# Patient Record
Sex: Female | Born: 1995
Health system: Southern US, Community
[De-identification: ages and names within clinical notes are randomized; demographics above are authoritative.]

---
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
## 2015-08-09 DIAGNOSIS — Z6820 Body mass index (BMI) 20.0-20.9, adult: Secondary | ICD-10-CM | POA: Diagnosis not present

## 2015-08-09 DIAGNOSIS — Z01419 Encounter for gynecological examination (general) (routine) without abnormal findings: Secondary | ICD-10-CM | POA: Diagnosis not present

## 2015-11-14 ENCOUNTER — Ambulatory Visit: Payer: Self-pay

## 2015-11-14 ENCOUNTER — Encounter: Payer: 59 | Admitting: Podiatry

## 2015-11-28 ENCOUNTER — Ambulatory Visit (INDEPENDENT_AMBULATORY_CARE_PROVIDER_SITE_OTHER): Payer: 59 | Admitting: Podiatry

## 2015-11-28 VITALS — BP 101/70 | HR 78 | Resp 16

## 2015-11-28 DIAGNOSIS — B078 Other viral warts: Secondary | ICD-10-CM

## 2015-11-28 DIAGNOSIS — B079 Viral wart, unspecified: Secondary | ICD-10-CM

## 2015-11-28 MED ORDER — FLUOROURACIL 5 % EX CREA: TOPICAL_CREAM | Freq: Two times a day (BID) | CUTANEOUS | Status: AC

## 2015-11-28 NOTE — Progress Notes (Signed)
   Subjective:    Patient ID: Ashley Acosta, female    DOB: 06/22/1996, 20 y.o.   MRN: [id number]  HPI    Review of Systems  All other systems reviewed and are negative.      Objective:   Physical Exam        Assessment & Plan:

## 2015-11-28 NOTE — Progress Notes (Signed)
Subjective:     Patient ID: Ashley Acosta, female   DOB: 08/28/1995, 20 y.o.   MRN: [id number]  HPI patient states I have lesions on the plantar aspect of my big toe right over left that has been going on now for approximately one year. I do not remember any kind of injury   Review of Systems  All other systems reviewed and are negative.      Objective:   Physical Exam  Constitutional: She is oriented to person, place, and time.  Cardiovascular: Intact distal pulses.   Musculoskeletal: Normal range of motion.  Neurological: She is oriented to person, place, and time.  Skin: Skin is warm.  Nursing note and vitals reviewed.  neurovascular status intact muscle strength adequate range of motion within normal limits with patient found to have numerous lesions plantar aspect right hallux and one on the plantar aspect of the left first metatarsal that upon debridement show pinpoint bleeding and are painful to lateral pressure. Patient has had a history of warts when she was a child     Assessment:     Verruca plantaris plantar aspect right hallux left first metatarsal    Plan:     H&P condition reviewed and today deep debridement accomplished with sterile instrumentation and apply chemical to create an immune reaction to both right hallux left first metatarsal and sterile dressings. I then prescribed Efudex to use at home 2 times a day and reappoint one month or earlier if needed and explained what to do if any blistering were to occur

## 2015-11-29 MED FILL — FLUOROURACIL 5% CREAM: 5 | 20 days supply | Qty: 40 | Fill #0

## 2015-11-29 NOTE — Progress Notes (Signed)
This encounter was created in error - please disregard.

## 2015-12-26 ENCOUNTER — Ambulatory Visit (INDEPENDENT_AMBULATORY_CARE_PROVIDER_SITE_OTHER): Payer: 59 | Admitting: Podiatry

## 2015-12-26 DIAGNOSIS — B079 Viral wart, unspecified: Secondary | ICD-10-CM | POA: Diagnosis not present

## 2015-12-26 DIAGNOSIS — B078 Other viral warts: Secondary | ICD-10-CM

## 2015-12-26 NOTE — Progress Notes (Signed)
Subjective:     Patient ID: Ashley Acosta, female   DOB: 09/08/1995, 20 y.o.   MRN: [id number]  HPI patient presents stating that the lesions are still present with moderate improvement and is using the topical medicine   Review of Systems     Objective:   Physical Exam Neurovascular status intact with continued discomfort and keratotic tissue right hallux and left first MPJ with upon debridement pinpoint bleeding and pain to lateral pressure    Assessment:     Verruca plantaris plantar aspect bilateral    Plan:     Reviewed condition and recommended continued conservative care. I went ahead today debrided tissue fully and applied chemical agent to create an immune response and applied sterile dressings. Patient was explain what to do if any blistering should occur and will be seen back to recheck in 4 weeks

## 2015-12-31 DIAGNOSIS — F331 Major depressive disorder, recurrent, moderate: Secondary | ICD-10-CM | POA: Diagnosis not present

## 2015-12-31 DIAGNOSIS — F411 Generalized anxiety disorder: Secondary | ICD-10-CM | POA: Diagnosis not present

## 2016-01-15 DIAGNOSIS — F411 Generalized anxiety disorder: Secondary | ICD-10-CM | POA: Diagnosis not present

## 2016-01-15 DIAGNOSIS — F331 Major depressive disorder, recurrent, moderate: Secondary | ICD-10-CM | POA: Diagnosis not present

## 2016-01-18 DIAGNOSIS — Z114 Encounter for screening for human immunodeficiency virus [HIV]: Secondary | ICD-10-CM | POA: Diagnosis not present

## 2016-01-18 DIAGNOSIS — Z113 Encounter for screening for infections with a predominantly sexual mode of transmission: Secondary | ICD-10-CM | POA: Diagnosis not present

## 2016-01-23 ENCOUNTER — Ambulatory Visit (INDEPENDENT_AMBULATORY_CARE_PROVIDER_SITE_OTHER): Payer: 59 | Admitting: Podiatry

## 2016-01-23 DIAGNOSIS — B070 Plantar wart: Secondary | ICD-10-CM | POA: Diagnosis not present

## 2016-01-23 DIAGNOSIS — B079 Viral wart, unspecified: Secondary | ICD-10-CM

## 2016-01-23 DIAGNOSIS — B078 Other viral warts: Secondary | ICD-10-CM

## 2016-01-23 NOTE — Progress Notes (Signed)
Subjective:     Patient ID: Ashley Acosta, female   DOB: October 20, 1995, 20 y.o.   MRN: [id number]  HPI patient states I seem to be improving on my left foot but I still have lesions   Review of Systems     Objective:   Physical Exam Neurovascular status intact muscle strength adequate with patient still noted to have keratotic lesions plantar aspect left that upon debridement show pinpoint bleeding and plain the lateral pressure    Assessment:     Verruca plantaris plantar left    Plan:     Sterile debridement of all lesions accomplished and applied chemical agent to create an immune response was sterile dressing. Continue home FU DEXA treatment

## 2016-01-28 DIAGNOSIS — F331 Major depressive disorder, recurrent, moderate: Secondary | ICD-10-CM | POA: Diagnosis not present

## 2016-01-28 DIAGNOSIS — F411 Generalized anxiety disorder: Secondary | ICD-10-CM | POA: Diagnosis not present

## 2016-02-20 ENCOUNTER — Ambulatory Visit: Payer: 59 | Admitting: Podiatry

## 2016-12-17 DIAGNOSIS — Z3200 Encounter for pregnancy test, result unknown: Secondary | ICD-10-CM | POA: Diagnosis not present

## 2016-12-17 DIAGNOSIS — Z113 Encounter for screening for infections with a predominantly sexual mode of transmission: Secondary | ICD-10-CM | POA: Diagnosis not present

## 2016-12-17 DIAGNOSIS — N760 Acute vaginitis: Secondary | ICD-10-CM | POA: Diagnosis not present

## 2016-12-17 DIAGNOSIS — Z01419 Encounter for gynecological examination (general) (routine) without abnormal findings: Secondary | ICD-10-CM | POA: Diagnosis not present

## 2016-12-17 DIAGNOSIS — Z681 Body mass index (BMI) 19 or less, adult: Secondary | ICD-10-CM | POA: Diagnosis not present

## 2017-06-03 DIAGNOSIS — B373 Candidiasis of vulva and vagina: Secondary | ICD-10-CM | POA: Diagnosis not present

## 2019-01-21 ENCOUNTER — Institutional Professional Consult (permissible substitution): Payer: Self-pay | Admitting: Plastic Surgery

## 2021-07-31 DIAGNOSIS — F331 Major depressive disorder, recurrent, moderate: Secondary | ICD-10-CM | POA: Diagnosis not present

## 2021-08-28 DIAGNOSIS — F331 Major depressive disorder, recurrent, moderate: Secondary | ICD-10-CM | POA: Diagnosis not present

## 2021-09-17 DIAGNOSIS — F331 Major depressive disorder, recurrent, moderate: Secondary | ICD-10-CM | POA: Diagnosis not present

## 2021-10-09 DIAGNOSIS — F331 Major depressive disorder, recurrent, moderate: Secondary | ICD-10-CM | POA: Diagnosis not present

## 2021-11-13 DIAGNOSIS — F331 Major depressive disorder, recurrent, moderate: Secondary | ICD-10-CM | POA: Diagnosis not present

## 2021-12-06 DIAGNOSIS — Z118 Encounter for screening for other infectious and parasitic diseases: Secondary | ICD-10-CM | POA: Diagnosis not present

## 2021-12-06 DIAGNOSIS — Z1159 Encounter for screening for other viral diseases: Secondary | ICD-10-CM | POA: Diagnosis not present

## 2021-12-06 DIAGNOSIS — Z6820 Body mass index (BMI) 20.0-20.9, adult: Secondary | ICD-10-CM | POA: Diagnosis not present

## 2021-12-06 DIAGNOSIS — Z124 Encounter for screening for malignant neoplasm of cervix: Secondary | ICD-10-CM | POA: Diagnosis not present

## 2021-12-06 DIAGNOSIS — Z113 Encounter for screening for infections with a predominantly sexual mode of transmission: Secondary | ICD-10-CM | POA: Diagnosis not present

## 2021-12-06 DIAGNOSIS — Z01419 Encounter for gynecological examination (general) (routine) without abnormal findings: Secondary | ICD-10-CM | POA: Diagnosis not present

## 2021-12-06 DIAGNOSIS — Z114 Encounter for screening for human immunodeficiency virus [HIV]: Secondary | ICD-10-CM | POA: Diagnosis not present

## 2021-12-11 DIAGNOSIS — F331 Major depressive disorder, recurrent, moderate: Secondary | ICD-10-CM | POA: Diagnosis not present

## 2021-12-12 ENCOUNTER — Ambulatory Visit: Payer: BC Managed Care – PPO | Admitting: Family Medicine

## 2021-12-12 ENCOUNTER — Encounter: Payer: Self-pay | Admitting: Family Medicine

## 2021-12-12 VITALS — BP 102/68 | HR 90 | Temp 98.1°F | Resp 16 | Ht 65.0 in | Wt 123.0 lb

## 2021-12-12 DIAGNOSIS — Z7689 Persons encountering health services in other specified circumstances: Secondary | ICD-10-CM

## 2021-12-12 DIAGNOSIS — R41840 Attention and concentration deficit: Secondary | ICD-10-CM | POA: Diagnosis not present

## 2021-12-12 DIAGNOSIS — F32A Depression, unspecified: Secondary | ICD-10-CM

## 2021-12-12 DIAGNOSIS — F419 Anxiety disorder, unspecified: Secondary | ICD-10-CM | POA: Diagnosis not present

## 2021-12-12 NOTE — Progress Notes (Signed)
Patient is her to established care. Patient is concern if she has ADHD. Patient said she has been researching and my have it

## 2021-12-12 NOTE — Progress Notes (Signed)
   New Patient Office Visit  Subjective    Patient ID: Ashley Acosta, female    DOB: [birth year]  Age: 26 y.o. MRN: [id number]  CC:  Chief Complaint  Patient presents with   Establish Care    HPI Ashley Acosta presents to establish care and for concern of attention deficit disorder. Patient reports that she is concerned that she may have ADHD. She has had meds for depression/anxiety in the past and had thought that her sx my be related.    No outpatient encounter medications on file as of 12/12/2021.   No facility-administered encounter medications on file as of 12/12/2021.    History reviewed. No pertinent past medical history.  History reviewed. No pertinent surgical history.  Family History  Problem Relation Age of Onset   Drug abuse Sister     Social History   Socioeconomic History   Marital status: Single    Spouse name: Not on file   Number of children: Not on file   Years of education: Not on file   Highest education level: Not on file  Occupational History   Not on file  Tobacco Use   Smoking status: Never   Smokeless tobacco: Never  Substance and Sexual Activity   Alcohol use: Yes   Drug use: Not Currently   Sexual activity: Not Currently  Other Topics Concern   Not on file  Social History Narrative   Not on file   Social Determinants of Health   Financial Resource Strain: Not on file  Food Insecurity: Not on file  Transportation Needs: Not on file  Physical Activity: Not on file  Stress: Not on file  Social Connections: Not on file  Intimate Partner Violence: Not on file    Review of Systems  Psychiatric/Behavioral:  Positive for depression. Negative for suicidal ideas. The patient is nervous/anxious.   All other systems reviewed and are negative.       Objective    BP 102/68   Pulse 90   Temp 98.1 F (36.7 C) (Oral)   Resp 16   Ht 5\' 5"  (1.651 m)   Wt 123 lb (55.8 kg)   SpO2 97%   BMI 20.47 kg/m   Physical  Exam Vitals and nursing note reviewed.  Constitutional:      General: She is not in acute distress. Cardiovascular:     Rate and Rhythm: Normal rate and regular rhythm.  Pulmonary:     Effort: Pulmonary effort is normal.     Breath sounds: Normal breath sounds.  Neurological:     General: No focal deficit present.     Mental Status: She is alert and oriented to person, place, and time.  Psychiatric:        Mood and Affect: Affect normal. Mood is anxious.        Speech: Speech is rapid and pressured.        Behavior: Behavior normal. Behavior is cooperative.         Assessment & Plan:   1. Attention deficit Patient to be evaluated by her counselor regarding sx  2. Anxiety and depression Continue present management (counselor)  3. Encounter to establish care     No follow-ups on file.   , MD

## 2022-01-08 DIAGNOSIS — F331 Major depressive disorder, recurrent, moderate: Secondary | ICD-10-CM | POA: Diagnosis not present

## 2022-01-22 DIAGNOSIS — F331 Major depressive disorder, recurrent, moderate: Secondary | ICD-10-CM | POA: Diagnosis not present

## 2022-02-04 DIAGNOSIS — F331 Major depressive disorder, recurrent, moderate: Secondary | ICD-10-CM | POA: Diagnosis not present

## 2022-02-04 DIAGNOSIS — F332 Major depressive disorder, recurrent severe without psychotic features: Secondary | ICD-10-CM | POA: Diagnosis not present

## 2022-02-18 DIAGNOSIS — F331 Major depressive disorder, recurrent, moderate: Secondary | ICD-10-CM | POA: Diagnosis not present

## 2022-02-18 DIAGNOSIS — F332 Major depressive disorder, recurrent severe without psychotic features: Secondary | ICD-10-CM | POA: Diagnosis not present

## 2022-02-19 DIAGNOSIS — F331 Major depressive disorder, recurrent, moderate: Secondary | ICD-10-CM | POA: Diagnosis not present

## 2022-04-01 DIAGNOSIS — F332 Major depressive disorder, recurrent severe without psychotic features: Secondary | ICD-10-CM | POA: Diagnosis not present

## 2022-04-01 DIAGNOSIS — F331 Major depressive disorder, recurrent, moderate: Secondary | ICD-10-CM | POA: Diagnosis not present

## 2022-06-04 DIAGNOSIS — F331 Major depressive disorder, recurrent, moderate: Secondary | ICD-10-CM | POA: Diagnosis not present

## 2022-06-04 DIAGNOSIS — Z79899 Other long term (current) drug therapy: Secondary | ICD-10-CM | POA: Diagnosis not present

## 2022-09-03 DIAGNOSIS — F331 Major depressive disorder, recurrent, moderate: Secondary | ICD-10-CM | POA: Diagnosis not present

## 2022-09-04 DIAGNOSIS — B354 Tinea corporis: Secondary | ICD-10-CM | POA: Diagnosis not present

## 2022-12-03 DIAGNOSIS — F331 Major depressive disorder, recurrent, moderate: Secondary | ICD-10-CM | POA: Diagnosis not present

## 2022-12-24 DIAGNOSIS — F331 Major depressive disorder, recurrent, moderate: Secondary | ICD-10-CM | POA: Diagnosis not present

## 2022-12-26 DIAGNOSIS — F331 Major depressive disorder, recurrent, moderate: Secondary | ICD-10-CM | POA: Diagnosis not present

## 2022-12-26 DIAGNOSIS — F900 Attention-deficit hyperactivity disorder, predominantly inattentive type: Secondary | ICD-10-CM | POA: Diagnosis not present

## 2023-01-07 DIAGNOSIS — F331 Major depressive disorder, recurrent, moderate: Secondary | ICD-10-CM | POA: Diagnosis not present

## 2023-03-10 DIAGNOSIS — F331 Major depressive disorder, recurrent, moderate: Secondary | ICD-10-CM | POA: Diagnosis not present

## 2023-03-10 DIAGNOSIS — F1020 Alcohol dependence, uncomplicated: Secondary | ICD-10-CM | POA: Diagnosis not present

## 2023-03-31 DIAGNOSIS — F331 Major depressive disorder, recurrent, moderate: Secondary | ICD-10-CM | POA: Diagnosis not present

## 2023-07-06 DIAGNOSIS — F1020 Alcohol dependence, uncomplicated: Secondary | ICD-10-CM | POA: Diagnosis not present

## 2023-07-07 DIAGNOSIS — F331 Major depressive disorder, recurrent, moderate: Secondary | ICD-10-CM | POA: Diagnosis not present

## 2023-07-10 DIAGNOSIS — F331 Major depressive disorder, recurrent, moderate: Secondary | ICD-10-CM | POA: Diagnosis not present

## 2023-10-07 DIAGNOSIS — F331 Major depressive disorder, recurrent, moderate: Secondary | ICD-10-CM | POA: Diagnosis not present

## 2023-10-07 DIAGNOSIS — F1020 Alcohol dependence, uncomplicated: Secondary | ICD-10-CM | POA: Diagnosis not present

## 2023-10-29 ENCOUNTER — Ambulatory Visit: Admitting: Family Medicine

## 2023-10-29 ENCOUNTER — Encounter: Admitting: Family Medicine

## 2023-12-16 ENCOUNTER — Encounter: Payer: Self-pay | Admitting: Family Medicine

## 2023-12-17 ENCOUNTER — Encounter: Admitting: Family Medicine

## 2024-01-06 DIAGNOSIS — F331 Major depressive disorder, recurrent, moderate: Secondary | ICD-10-CM | POA: Diagnosis not present

## 2024-01-06 DIAGNOSIS — F1020 Alcohol dependence, uncomplicated: Secondary | ICD-10-CM | POA: Diagnosis not present

## 2024-04-26 DIAGNOSIS — F331 Major depressive disorder, recurrent, moderate: Secondary | ICD-10-CM | POA: Diagnosis not present

## 2024-06-07 ENCOUNTER — Other Ambulatory Visit (HOSPITAL_COMMUNITY): Payer: Self-pay

## 2024-06-07 DIAGNOSIS — F331 Major depressive disorder, recurrent, moderate: Secondary | ICD-10-CM | POA: Diagnosis not present

## 2024-06-07 MED ORDER — FLUOXETINE HCL 20 MG PO TABS
20.0000 mg | ORAL_TABLET | Freq: Every day | ORAL | 0 refills | Status: AC
  Filled 2024-06-07 – 2024-06-22 (×3): qty 90, 90d supply, fill #0

## 2024-06-07 MED ORDER — AMPHETAMINE-DEXTROAMPHETAMINE 20 MG PO TABS: 20.0000 mg | ORAL_TABLET | Freq: Two times a day (BID) | ORAL | 0 refills | Status: AC

## 2024-06-07 MED ORDER — AMPHETAMINE-DEXTROAMPHETAMINE 20 MG PO TABS
20.0000 mg | ORAL_TABLET | Freq: Two times a day (BID) | ORAL | 0 refills | Status: AC
  Filled 2024-07-20: qty 60, 30d supply, fill #0

## 2024-06-07 MED ORDER — AMPHETAMINE-DEXTROAMPHETAMINE 20 MG PO TABS
20.0000 mg | ORAL_TABLET | Freq: Two times a day (BID) | ORAL | 0 refills | Status: AC
  Filled 2024-06-22 (×2): qty 60, 30d supply, fill #0

## 2024-06-18 ENCOUNTER — Other Ambulatory Visit (HOSPITAL_COMMUNITY): Payer: Self-pay

## 2024-06-21 ENCOUNTER — Other Ambulatory Visit: Payer: Self-pay

## 2024-06-21 ENCOUNTER — Other Ambulatory Visit (HOSPITAL_COMMUNITY): Payer: Self-pay

## 2024-06-22 ENCOUNTER — Other Ambulatory Visit: Payer: Self-pay

## 2024-06-22 ENCOUNTER — Other Ambulatory Visit (HOSPITAL_COMMUNITY): Payer: Self-pay

## 2024-07-05 ENCOUNTER — Other Ambulatory Visit (HOSPITAL_COMMUNITY): Payer: Self-pay

## 2024-07-20 ENCOUNTER — Other Ambulatory Visit (HOSPITAL_COMMUNITY): Payer: Self-pay

## 2024-07-20 ENCOUNTER — Other Ambulatory Visit: Payer: Self-pay
